# Patient Record
Sex: Male | Born: 1995 | Race: White | Hispanic: No | Marital: Single | Smoking: Former smoker
Health system: Southern US, Community
[De-identification: ages and names within clinical notes are randomized; demographics above are authoritative.]

## PROBLEM LIST (undated history)

## (undated) HISTORY — PX: TONSILLECTOMY: SUR1361

---
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
## 2014-04-08 ENCOUNTER — Ambulatory Visit: Payer: Self-pay

## 2014-05-02 ENCOUNTER — Ambulatory Visit: Payer: Self-pay | Admitting: Physician Assistant

## 2014-09-28 ENCOUNTER — Ambulatory Visit: Payer: Self-pay | Admitting: Emergency Medicine

## 2015-02-02 ENCOUNTER — Ambulatory Visit
Admit: 2015-02-02 | Disposition: A | Discharge status: Home / Self Care | Payer: Self-pay | Attending: Family Medicine | Admitting: Family Medicine

## 2015-02-07 ENCOUNTER — Other Ambulatory Visit: Payer: Self-pay | Admitting: Orthopedic Surgery

## 2015-02-07 DIAGNOSIS — S83412A Sprain of medial collateral ligament of left knee, initial encounter: Secondary | ICD-10-CM

## 2015-02-07 DIAGNOSIS — M2202 Recurrent dislocation of patella, left knee: Secondary | ICD-10-CM

## 2015-02-07 DIAGNOSIS — M25462 Effusion, left knee: Secondary | ICD-10-CM

## 2015-02-08 ENCOUNTER — Other Ambulatory Visit: Payer: Self-pay

## 2015-02-14 ENCOUNTER — Ambulatory Visit: Payer: Self-pay

## 2019-09-09 ENCOUNTER — Other Ambulatory Visit: Payer: Self-pay

## 2019-09-09 ENCOUNTER — Emergency Department

## 2019-09-09 ENCOUNTER — Emergency Department
Admission: EM | Admit: 2019-09-09 | Discharge: 2019-09-10 | Disposition: A | Discharge status: Home / Self Care | Attending: Emergency Medicine | Admitting: Emergency Medicine

## 2019-09-09 DIAGNOSIS — R55 Syncope and collapse: Secondary | ICD-10-CM | POA: Diagnosis not present

## 2019-09-09 DIAGNOSIS — R778 Other specified abnormalities of plasma proteins: Secondary | ICD-10-CM | POA: Diagnosis not present

## 2019-09-09 DIAGNOSIS — Z87891 Personal history of nicotine dependence: Secondary | ICD-10-CM | POA: Diagnosis not present

## 2019-09-09 DIAGNOSIS — T7840XA Allergy, unspecified, initial encounter: Secondary | ICD-10-CM | POA: Insufficient documentation

## 2019-09-09 LAB — BASIC METABOLIC PANEL
Anion gap: 13 (ref 5–15)
BUN: 14 mg/dL (ref 6–20)
CO2: 24 mmol/L (ref 22–32)
Calcium: 9.1 mg/dL (ref 8.9–10.3)
Chloride: 100 mmol/L (ref 98–111)
Creatinine, Ser: 0.97 mg/dL (ref 0.61–1.24)
GFR calc Af Amer: >60 mL/min (ref >60)
GFR calc non Af Amer: >60 mL/min (ref >60)
Glucose, Bld: 123 mg/dL — ABNORMAL HIGH (ref 70–99)
Potassium: 3.6 mmol/L (ref 3.5–5.1)
Sodium: 137 mmol/L (ref 135–145)

## 2019-09-09 LAB — CBC
HCT: 44.1 % (ref 39.0–52.0)
Hemoglobin: 15.3 g/dL (ref 13.0–17.0)
MCH: 30.5 pg (ref 26.0–34.0)
MCHC: 34.7 g/dL (ref 30.0–36.0)
MCV: 88 fL (ref 80.0–100.0)
Platelets: 293 10*3/uL (ref 150–400)
RBC: 5.01 MIL/uL (ref 4.22–5.81)
RDW: 13.2 % (ref 11.5–15.5)
WBC: 16.4 10*3/uL — ABNORMAL HIGH (ref 4.0–10.5)
nRBC: 0 % (ref 0.0–0.2)

## 2019-09-09 LAB — TROPONIN I (HIGH SENSITIVITY)
Troponin I (High Sensitivity): 7 ng/L (ref <18)
Troponin I (High Sensitivity): 18 ng/L — ABNORMAL HIGH (ref <18)

## 2019-09-09 MED ORDER — EPINEPHRINE 0.3 MG/0.3ML IJ SOAJ: 0.3000 mg | INTRAMUSCULAR | 0 refills | Status: AC | PRN

## 2019-09-09 MED ORDER — ACETAMINOPHEN 500 MG PO TABS
1000.0000 mg | ORAL_TABLET | Freq: Once | ORAL | Status: AC
  Administered 2019-09-09: 1000 mg via ORAL
  Filled 2019-09-09: qty 2

## 2019-09-09 MED ORDER — FAMOTIDINE IN NACL 20-0.9 MG/50ML-% IV SOLN
20.0000 mg | Freq: Once | INTRAVENOUS | Status: AC
  Administered 2019-09-09: 22:00:00 20 mg via INTRAVENOUS
  Filled 2019-09-09: qty 50

## 2019-09-09 MED ORDER — METHYLPREDNISOLONE SODIUM SUCC 125 MG IJ SOLR
125.0000 mg | Freq: Once | INTRAMUSCULAR | Status: AC
  Administered 2019-09-09: 125 mg via INTRAVENOUS
  Filled 2019-09-09: qty 2

## 2019-09-09 MED ORDER — SODIUM CHLORIDE 0.9 % IV BOLUS
1000.0000 mL | Freq: Once | INTRAVENOUS | Status: AC
  Administered 2019-09-09: 1000 mL via INTRAVENOUS

## 2019-09-09 NOTE — ED Provider Notes (Signed)
[facility] Emergency Department Provider Note  ____________________________________________   First MD Initiated Contact with Patient 09/09/19 2107     (approximate)  I have reviewed the triage vital signs and the nursing notes.  History  Chief Complaint Allergic Reaction    HPI Chad Williams is a 23 y.o. male with no significant past medical history who presents to the emergency department with concern for allergic reaction and syncopal episode.  Patient states this evening he tried eating shark meat for the first time.  He felt like he was having allergic reaction after this - he felt like his lips and throat were swelling and tingling. Denies any rash, vomiting, diarrhea, or urticaria.  He took 75 mg of Benadryl and took a nap. This improved his swelling symptoms, however when he woke up to use the restroom, when he stood up to go to the bathroom he lost consciousness.  He denies any preceding chest pain, palpitations, lightheadedness, visual changes, chest pain.  He did hit his head and has an abrasion to the forehead and top of his head.  He regained consciousness shortly thereafter, no shaking activity, no postictal state described.  He stood up quickly after this fall, and had another questionable syncopal event.  He does admit to drinking 5 alcoholic drinks today.  On arrival to the emergency department, he denies any further allergy type symptoms aside from some mild subjective tingling sensation in his throat.  He denies any lip, tongue swelling.  He denies any difficulty breathing.  No wheezing.  No rashes.  No GI symptoms.  No history of anaphylaxis.  No personal or family history of arrhythmias or seizure.  No history of sudden cardiac death.   Past Medical Hx History reviewed. No pertinent past medical history.  Problem List There are no active problems to display for this patient.   Past Surgical Hx Past Surgical History:  Procedure  Laterality Date  . TONSILLECTOMY      Medications Prior to Admission medications   Not on File    Allergies Patient has no known allergies.  Family Hx No family history on file.  Social Hx Social History   Tobacco Use  . Smoking status: Former Research scientist (life sciences)  . Smokeless tobacco: Never Used  Substance Use Topics  . Alcohol use: Not on file  . Drug use: Not on file     Review of Systems  Constitutional: Negative for fever, chills. Eyes: Negative for visual changes. ENT: Negative for sore throat. Cardiovascular: Negative for chest pain. Respiratory: Negative for shortness of breath. Gastrointestinal: Negative for nausea, vomiting.  Genitourinary: Negative for dysuria. Musculoskeletal: Negative for leg swelling. Skin: Negative for rash. + abrasion to forehead, top of head Neurological: Negative for for headaches. + sycnope   Physical Exam  Vital Signs: ED Triage Vitals  Enc Vitals Group     BP 09/09/19 2040 129/65     Pulse Rate 09/09/19 2040 77     Resp 09/09/19 2040 17     Temp 09/09/19 2040 99 F (37.2 C)     Temp Source 09/09/19 2040 Oral     SpO2 09/09/19 2040 100 %     Weight 09/09/19 2038 195 lb (88.5 kg)     Height 09/09/19 2038 5\' 9"  (1.753 m)     Head Circumference --      Peak Flow --      Pain Score 09/09/19 2037 6     Pain Loc --  Pain Edu? --      Excl. in GC? --     Constitutional: Alert and oriented.  Head: Normocephalic.  Abrasion to the left sided forehead, and central frontoparietal scalp.  Eyes: Conjunctivae clear. Sclera anicteric. Nose: No congestion. No rhinorrhea. Mouth/Throat: Wearing mask.  No intraoral or dental trauma.  No tongue or lip lacerations.  No lip, tongue, uvular swelling. Neck: No stridor.   Cardiovascular: Normal rate, regular rhythm. Extremities well perfused. Respiratory: Normal respiratory effort.  Lungs CTAB.  No wheezing. Gastrointestinal: Soft. Non-tender. Non-distended.  Musculoskeletal: No lower extremity  edema. No deformities. Neurologic:  Normal speech and language. No gross focal neurologic deficits are appreciated. Alert and oriented.  Face symmetric.  Tongue midline.  Cranial nerves II through XII intact. UE and LE strength 5/5 and symmetric. UE and LE SILT.  Skin: Skin is warm, dry and intact. No rash noted. Psychiatric: Mood and affect are appropriate for situation.  EKG  Personally reviewed.   Rate: 69 Rhythm: sinus Axis: normal Intervals: WNL TWI II, III, avF Non-specific T wave abnormality No evidence of Brugada, WPW, or prolonged QTC Sinus rhythm with sinus arrhythmia No STEMI    Radiology  CT head:  IMPRESSION: No acute intracranial process.   XR: IMPRESSION:  No active cardiopulmonary disease.    Procedures  Procedure(s) performed (including critical care):  Procedures   Initial Impression / Assessment and Plan / ED Course  23 y.o. male who presents to the ED with concern for potential allergic reaction, as well as a syncopal event.  Ddx: vasovagal syncope vs orthostatic syncope, allergic reaction. No seizure like activity described. Also consider medication reaction given benadryl in setting of alcohol use. Dehydration 2/2 alcohol use. Also consider arrhythmia.   Will obtain labs, imaging, EKG, fluids and reassess. Will complete allergy treatment with steroids and famotidine given subjective throat tingling, though objectively exam is reassuring.   Electrolytes without actionable derangements.  Non-specific leukocytosis at 16, suspect reaction given no other infectious symptoms.  CT head and XR negative.    EKG reveals sinus rhythm with sinus arrhythmia, TWI in inferior leads, no evidence of Brugada or WPW.  Initial troponin negative (7), repeat 18. Will obtain a 3rd delta troponin for trending. If elevated, anticipate observation admission in setting of syncope. If negative, discharge with close outpatient follow up would be appropriate.   If repeat  troponin is negative, will discharge with Rx for Epipen to use as needed (discussed how to use, and neccessity of ED evaluation should he administer it) and outpatient follow-up.   Final Clinical Impression(s) / ED Diagnosis  Final diagnoses:  Syncope, unspecified syncope type       Note:  This document was prepared using Dragon voice recognition software and may include unintentional dictation errors.   Miguel Aschoff., MD 09/09/19 [phone]

## 2019-09-09 NOTE — ED Triage Notes (Addendum)
Pt arrives to ED via POV from home with c/o syncopal episode and possible allergic reaction. Pt states he "ate shark meat" for the first time ever today and felt like his throat was closing up so he took 3 Benadryl and went to bed. Pt states when he woke up he went to the bathroom and "passed out and hit his head" . EMS was called and told him everything checked out fine, but his parents insisted he be evaluated. Pt reports anterior head pain, denies neck or back pain. Pt reports 5 alcoholic drinks today, but denies drug use. Pt denies any c/o SHOB, itching, trouble swallowing, or other allergic reaction s/x's at this time.Pt is A&O, in NAD; RR even, regular, and unlabored.

## 2019-09-09 NOTE — ED Notes (Signed)
Pt states he has headache. No c/o SOB. Pt states throat still feels "funny" but better than before.

## 2019-09-10 DIAGNOSIS — R55 Syncope and collapse: Secondary | ICD-10-CM

## 2019-09-10 DIAGNOSIS — R778 Other specified abnormalities of plasma proteins: Secondary | ICD-10-CM

## 2019-09-10 LAB — TROPONIN I (HIGH SENSITIVITY): Troponin I (High Sensitivity): 20 ng/L — ABNORMAL HIGH (ref <18)

## 2019-09-10 NOTE — ED Notes (Signed)
Patient to be discharged by hospitalist

## 2019-09-10 NOTE — Discharge Instructions (Signed)
From Dr. Loleta Books:  If you have another allergic reaction that involves lip or tongue swelling, difficulty breathing, severe rash or vomiting, please administer the EpiPen as we have discussed.  It is extremely important that if you administer the EpiPen, you present to the emergency department for evaluation to ensure your allergic reaction is improving.  I would in particular recommend having Epipen available if you eat fish again.  You can continue to take over-the-counter Benadryl, 25 mg every 8 hours as needed for any tingling or residual allergic reaction.  Please follow-up with your primary care doctor to review your ER visit and follow-up on your symptoms. Specifically:  Your electrolytes and renal function were normal. Your blood counts were normal. You had a "non-contrast CT of the head" that was normal You had an EKG that showed nonspecific changes only, without "ST depressions" We measured your "high-sensitivity troponin" and found that this was 2 units over the upper limit of normal; given that you feel back to normal, had no exertional symptoms in your chest, and have an excellent exercise capacity at baseline, this slightly abnormal troponin is very unlikely to represent heart disease.  Rather, it is more likely that your blood pressure dropped briefly during the episode when you passed out, and that this resulted in the very small abnormal troponin test that we detected.  However, I would still like you to see your primary care doctor as soon as [place] Let her know that you had this episode and review this paperwork with her.  Let her know if you have had any chest symptoms since this episode.  I feel it would be reasonable to refer you to a Cardiologist to determine if you would benefit from any further imaging (such as echocardiogram) or stress testing.  Please return to the emergency department sooner for any new or worsening symptoms, such as chest pain  with exertion, trouble breathing, or abnormal heart beats.

## 2019-09-10 NOTE — ED Provider Notes (Signed)
Patient evaluated by Hospitalist Dr. Loleta Books and cleared for discharge by him. Documents for discharge printed by ED MD.   Rudene Re, MD 09/10/19 [phone]

## 2019-09-10 NOTE — Consult Note (Signed)
Consultation note  Patient Name: Chad Williams     BFX:[id number]    DOB: 05/13/1996    DOA: 09/09/2019  Patient coming from: Home  Chief Complaint: Syncope, lip swelling      HPI: Chad Williams is a 23 y.o. M with no significant PMHx who presents with acute lip swelling, syncope.  The patient was in his usual state of health until this afternoon, he had eaten shark meat for the first time, and within minutes felt his lips were swelling, his tongue was tingling, had heartburn and itching and rash on his chest.  He took some diphenhydramine and heartburn medicine, lay on the couch for 20 minutes, feeling better, went to the bathroom and was getting off toilet when he started to go black and then passed out.  He had no exertional discomfort, chest pain, palpitations, or dyspnea prior.  Stood up and then passed out again, and so EMS were called, who felt he checked out fine.  Family subsequently brought him to the ER.  In the ER, afebrile, heart rate blood pressure normal, pulse ox normal on room air.  Electrolytes and renal function normal.  Complete blood count normal.  Initial troponin negative.  CT head unremarkable.  Chest x-ray clear.  ECG showed normal sinus rhythm, no ST changes, nonspecific T wave changes in II, III, avF.  Repeat troponin at ULN and third draw 20 ng/L and so hospitalist service were consulted.  Patient typically able to exert strenuously without symptoms, including in the last week.  He does not smoke or use illicit drugs.  No family history premature CV disease nor SCD.      ROS: Review of Systems  Constitutional: Negative for chills and fever.  Respiratory: Negative for shortness of breath.   Cardiovascular: Negative for chest pain and palpitations.  Gastrointestinal: Positive for heartburn. Negative for nausea and vomiting.  Skin: Positive for itching and rash.  Neurological: Positive for loss of consciousness.          History reviewed. No  pertinent past medical history.  Past Surgical History:  Procedure Laterality Date   TONSILLECTOMY      [place] History[place]  The patient walks unassisted.  Nonsmoker.  No cocaine or illicit drugs.  Alcohol use occasionally.  No Known Allergies  Family history: No family history of cardiac disease, arrhythmia, sudden death.  Prior to Admission medications   Medication Sig Start Date End Date Taking? Authorizing Provider  diphenhydrAMINE (BENADRYL) 25 mg capsule Take 25 mg by mouth every 6 (six) hours as needed for allergies.   Yes [provider]  ibuprofen (ADVIL) 200 MG tablet Take 200 mg by mouth every 6 (six) hours as needed for mild pain.   Yes [provider]  EPINEPHrine 0.3 mg/0.3 mL IJ SOAJ injection Inject 0.3 mLs (0.3 mg total) into the muscle as needed for anaphylaxis. 09/09/19   Miguel Aschoff., MD       Physical Exam: BP (!) 141/70    Pulse 76    Temp 99 F (37.2 C) (Oral)    Resp (!) 24    Ht 5\' 9"  (1.753 m)    Wt 88.5 kg    SpO2 96%    BMI 28.80 kg/m  General appearance: Well-developed, adult male, alert and in no acute distress.   Eyes: Anicteric, conjunctiva pink, lids and lashes normal. PERRL.    ENT: No nasal deformity, discharge, epistaxis.  Hearing normal. OP moist without lesions,  tongue normal.  Lips normal without swelling, dentition good. Neck: No neck masses.  Trachea midline.  No thyromegaly/tenderness. Lymph: No cervical or supraclavicular lymphadenopathy. Skin: Warm and dry.  No jaundice.  No suspicious rashes or lesions. Cardiac: RRR, nl S1-S2, no murmurs appreciated.  Capillary refill is brisk.  JVP normal.  No LE edema.  Radial pulses 2+ and symmetric. Respiratory: Normal respiratory rate and rhythm.  CTAB without rales or wheezes. Abdomen: Abdomen soft.  No TTP. No ascites, distension, hepatosplenomegaly.   MSK: No deformities or effusions of the large joints of the upper or lower extremities  bilaterally.  No cyanosis or clubbing. Neuro: Cranial nerves normal.  Sensation intact to light touch. Speech is fluent.  Muscle strength normal.    Psych: Sensorium intact and responding to questions, attention normal.  Behavior appropriate.  Affect normal.  Judgment and insight appear normal.     Labs on Admission:  I have personally reviewed following labs and imaging studies: CBC: Recent Labs  Lab 09/09/19 2050  WBC 16.4*  HGB 15.3  HCT 44.1  MCV 88.0  PLT 293   Basic Metabolic Panel: Recent Labs  Lab 09/09/19 2050  NA 137  K 3.6  CL 100  CO2 24  GLUCOSE 123*  BUN 14  CREATININE 0.97  CALCIUM 9.1   GFR: Estimated Creatinine Clearance: 128.1 mL/min (by C-G formula based on SCr of 0.97 mg/dL).         Radiological Exams on Admission: Personally reviewed CXR shows no focal opacity or airspace disease, CT head report reviewed, showed NAICP: Dg Chest 2 View  Result Date: 09/09/2019 CLINICAL DATA:  Recent syncopal episode and possible allergic reaction EXAM: CHEST - 2 VIEW COMPARISON:  None. FINDINGS: The heart size and mediastinal contours are within normal limits. Both lungs are clear. The visualized skeletal structures are unremarkable. IMPRESSION: No active cardiopulmonary disease. Electronically Signed   By: Alcide CleverMark  Lukens M.D.   On: 09/09/2019 21:00   Ct Head Wo Contrast  Result Date: 09/09/2019 CLINICAL DATA:  Headache after a syncopal episode. EXAM: CT HEAD WITHOUT CONTRAST TECHNIQUE: Contiguous axial images were obtained from the base of the skull through the vertex without intravenous contrast. COMPARISON:  None. FINDINGS: Brain: No evidence of acute infarction, hemorrhage, hydrocephalus, extra-axial collection or mass lesion/mass effect. Vascular: No hyperdense vessel or unexpected calcification. Skull: Normal. Negative for fracture or focal lesion. Sinuses/Orbits: No acute finding. Other: None. IMPRESSION: No acute intracranial process. Electronically Signed    By: Romona Curlsyler  Litton M.D.   On: 09/09/2019 21:19    EKG: Independently reviewed. Rate 80s, normal sinus rhythm.  Normal ST segments.  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   -Prescribed with Epipen -Precautions re: fish allergy given   Elevated high sensitivity troponin  Patient's high sensitivity troponin was at the upper limit of normal, low and flat in the absence of chest pain.    This level of hsTroponin is hard to interpret but clearly nonspecific and very unlikely to be ACS.  In a low risk patient such as this (healthy 23 years old, nonsmoker, no family hx), in the absence of chest pain, it is much more likely to be clinically insignificant that to be ischemic heart disease of any kind. Likewise TW inversions are a nonspecific finding.    PE extremely  unlikely based on history.  No arrhythmia noted on telemetry monitoring here in the hospital, and are unlikely the cause of syncope, which is better explained by #1 above.  His presenting symptoms are now  -Follow up with PCP -Return precautions given for chest pain, exertional symptoms, palpitations          Medical decision making: Patient seen at 3:30 AM on 09/10/2019.  The patient was discussed with Dr. Alfred Levins.  What exists of the patient's chart was reviewed in depth and summarized above.  Clinical condition: stable for discharge.        [facility] Please page though [place] or Epic secure chat:  For password, contact charge nurse

## 2019-09-10 NOTE — ED Notes (Addendum)
EDP in room with pt. Pt alert, no c/o chest pain at this time

## 2019-09-10 NOTE — ED Provider Notes (Signed)
-----------------------------------------   2:18 AM on 09/10/2019 -----------------------------------------   Blood pressure 131/71, pulse 92, temperature 99 F (37.2 C), temperature source Oral, resp. rate 20, height 5\' 9"  (1.753 m), weight 88.5 kg, SpO2 97 %.  Assuming care from Dr. Joan Mayans of [place] [facility]win is a 23 y.o. male with a chief complaint of Allergic Reaction .    Please refer to H&P by previous MD for further details.  Third troponin increasing. Patient with abnormal EKG and syncope. Will admit to [place], [place], MD 09/10/19 [phone]

## 2019-09-10 NOTE — ED Notes (Signed)
Patient discharged per hospitalist, understands all follow up instructions and when to seek follow up care.

## 2020-08-18 IMAGING — CT CT HEAD W/O CM
3 series · 15 of 47 positions shown, 18 images · non-contrast
Comparison: None.

CLINICAL DATA: Headache after a syncopal episode.

EXAM:
CT HEAD WITHOUT CONTRAST
TECHNIQUE: Contiguous axial images were obtained from the base of the skull
through the vertex without intravenous contrast.

[Series 2: head wo · axial · 0.45mm/px · z∈[-82,+43]mm · 9 of 30 slices shown, 12 images]
[im 3/30  brain]
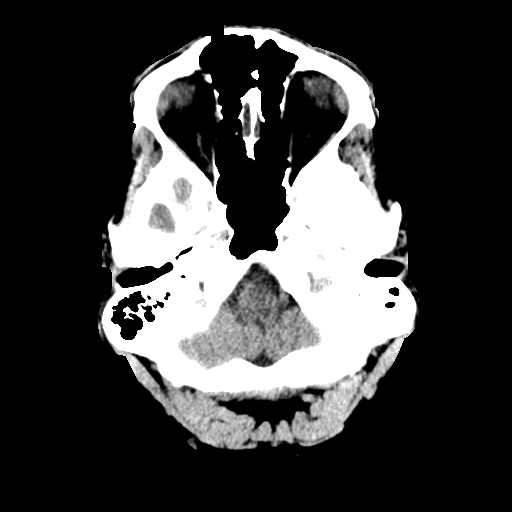
[im 3/30  bone]
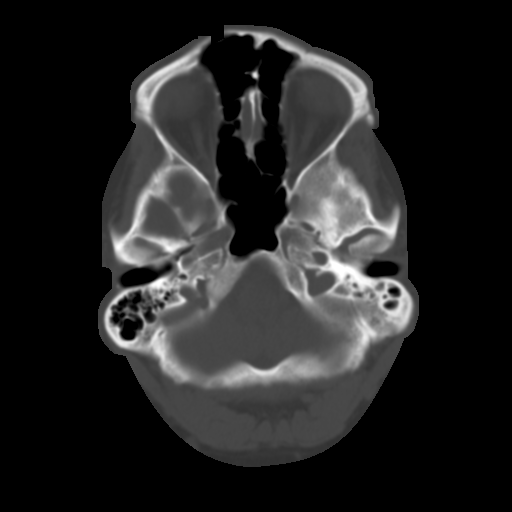
[im 6/30  brain]
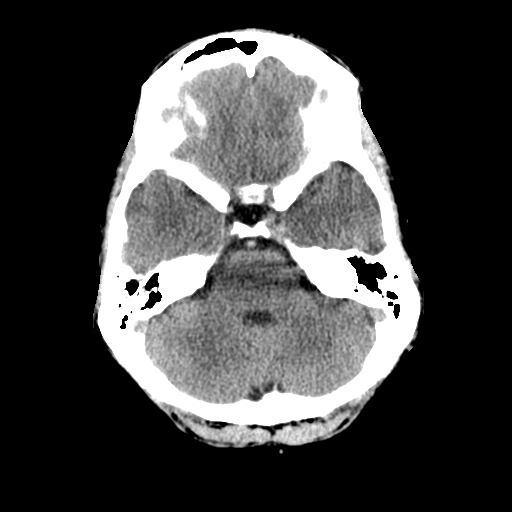
[im 9/30  brain]
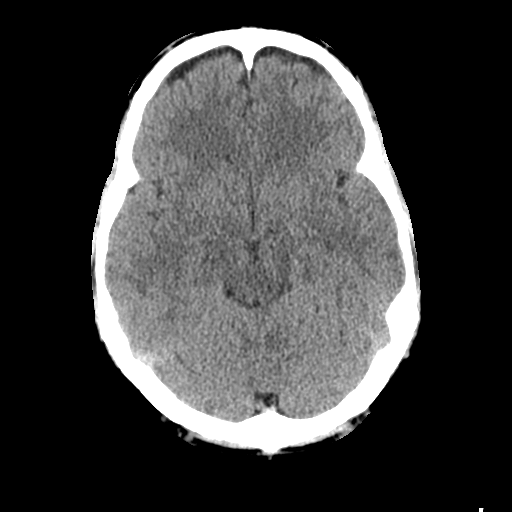
[im 12/30  brain]
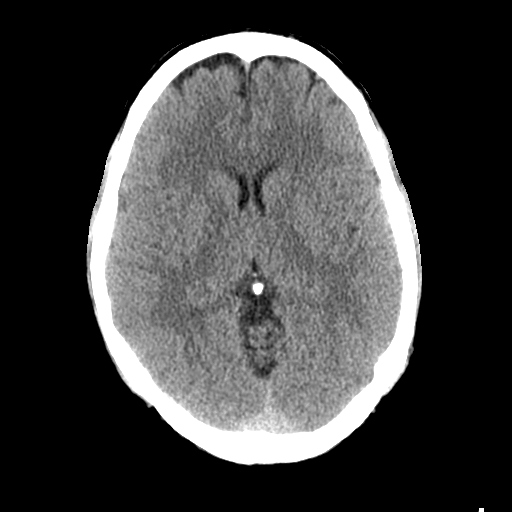
[im 16/30  brain]
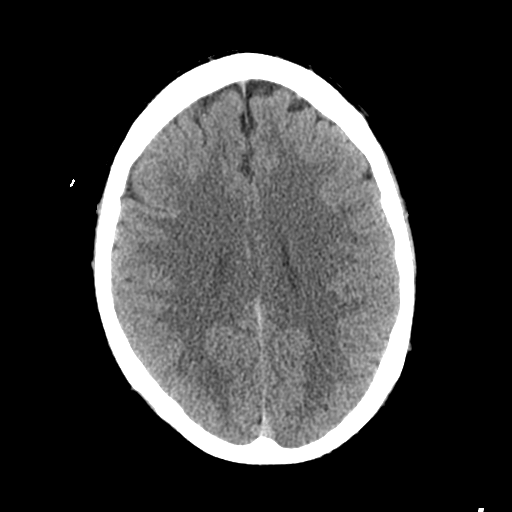
[im 16/30  bone]
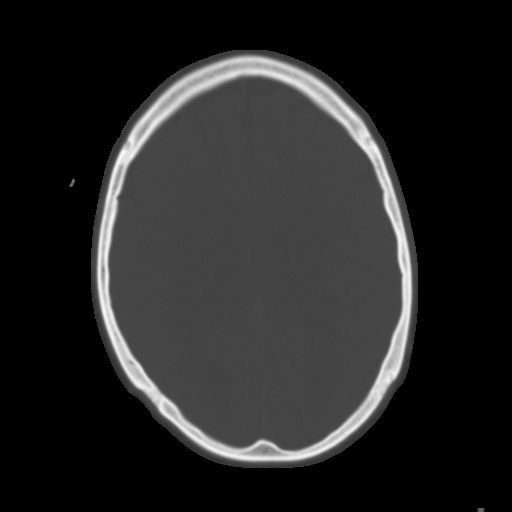
[im 19/30  brain]
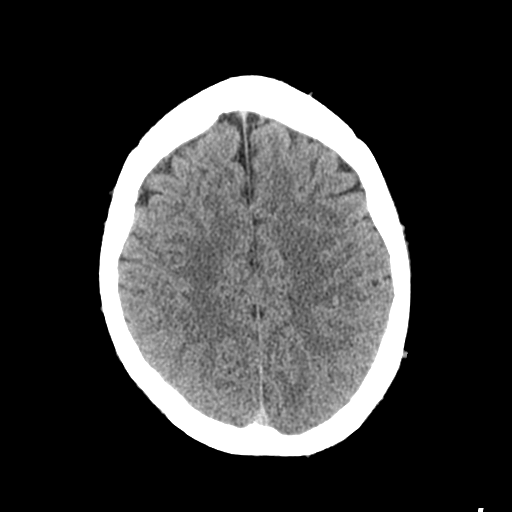
[im 22/30  brain]
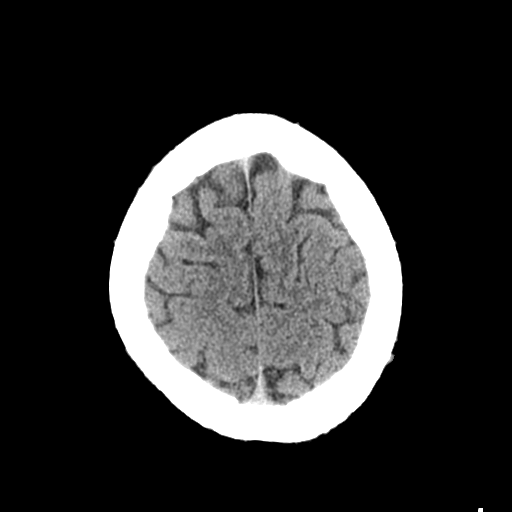
[im 25/30  brain]
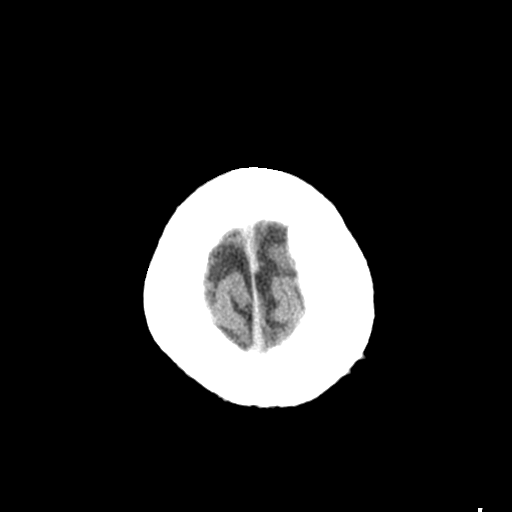
[im 28/30  brain]
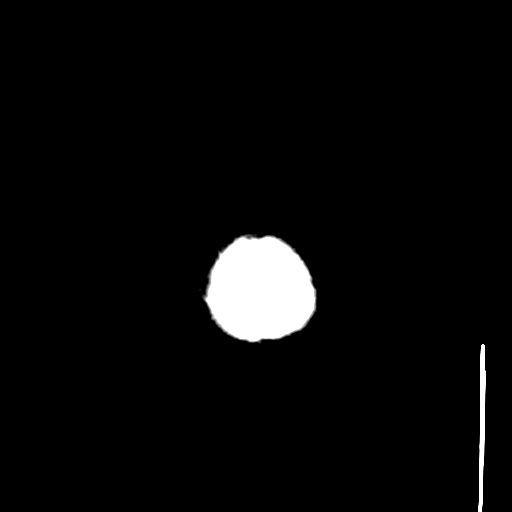
[im 28/30  bone]
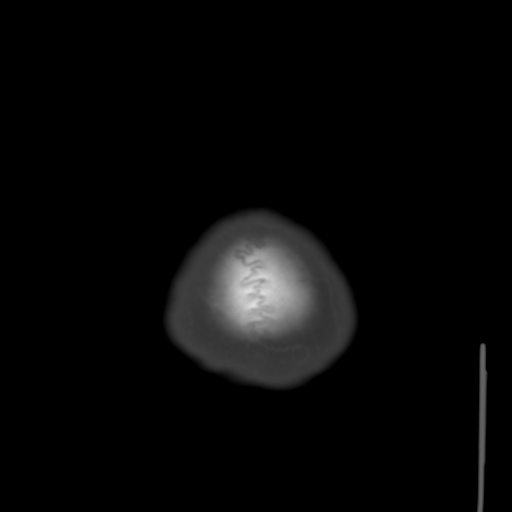

[Series 4: coronal soft tissue · coronal · 0.29mm/px · 3 of 68 slices shown]
[im 23/68  brain]
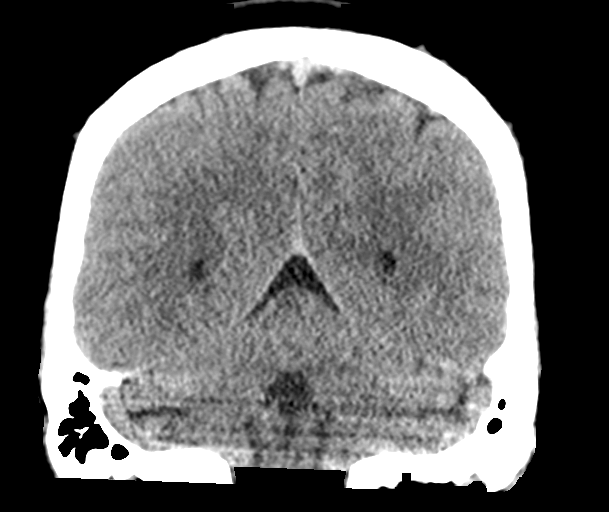
[im 30/68  brain]
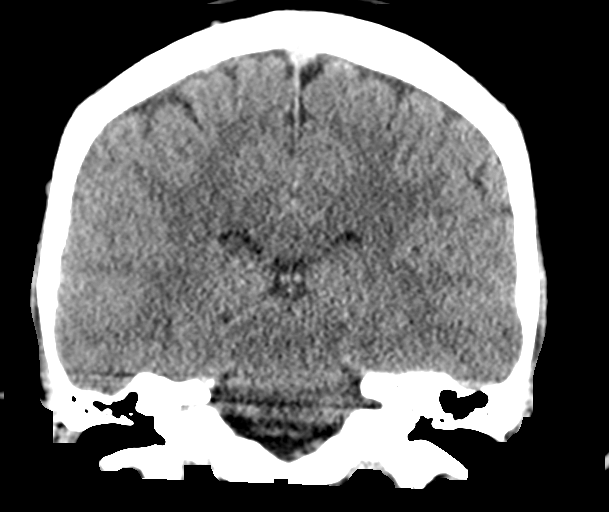
[im 38/68  brain]
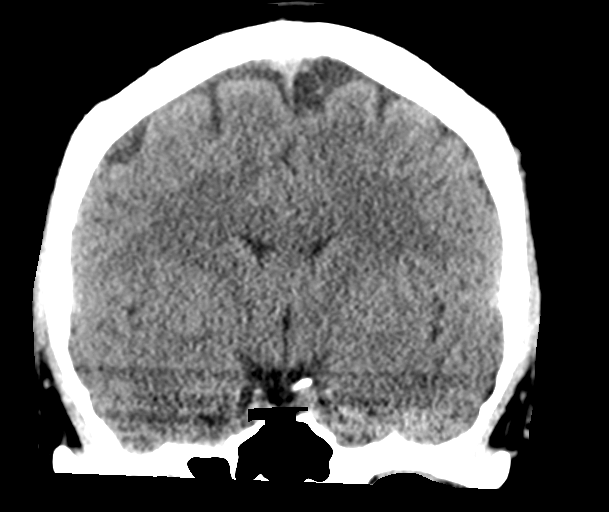

[Series 5: sagittal soft tissue · sagittal · 0.29mm/px · 3 of 56 slices shown]
[im 19/56  brain]
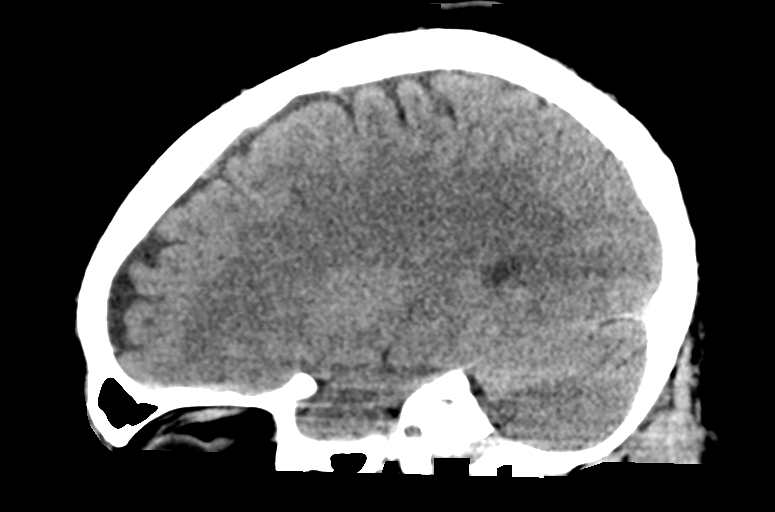
[im 28/56  brain]
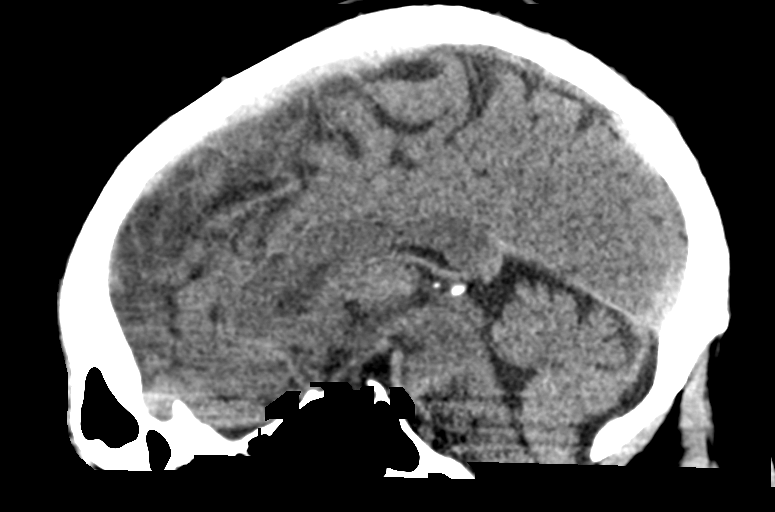
[im 37/56  brain]
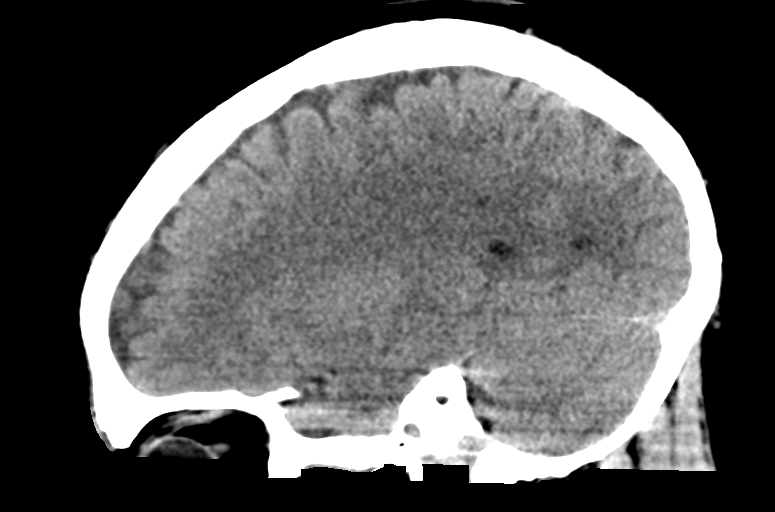

[15 of 47 positions shown; findings below may reference images not displayed]

FINDINGS: Brain: No evidence of acute infarction, hemorrhage, hydrocephalus,
extra-axial collection or mass lesion/mass effect.

Vascular: No hyperdense vessel or unexpected calcification.

Skull: Normal. Negative for fracture or focal lesion.

Sinuses/Orbits: No acute finding.

Other: None.
IMPRESSION: No acute intracranial process.
